# Patient Record
Sex: Male | Born: 1985 | Hispanic: Yes | Marital: Married | Smoking: Former smoker
Health system: Southern US, Community
[De-identification: ages and names within clinical notes are randomized; demographics above are authoritative.]

## PROBLEM LIST (undated history)

## (undated) DIAGNOSIS — M51369 Other intervertebral disc degeneration, lumbar region without mention of lumbar back pain or lower extremity pain: Secondary | ICD-10-CM

## (undated) DIAGNOSIS — M4800 Spinal stenosis, site unspecified: Secondary | ICD-10-CM

## (undated) DIAGNOSIS — R32 Unspecified urinary incontinence: Secondary | ICD-10-CM

## (undated) DIAGNOSIS — I10 Essential (primary) hypertension: Secondary | ICD-10-CM

## (undated) DIAGNOSIS — G629 Polyneuropathy, unspecified: Secondary | ICD-10-CM

## (undated) DIAGNOSIS — M5136 Other intervertebral disc degeneration, lumbar region: Secondary | ICD-10-CM

## (undated) HISTORY — PX: OTHER SURGICAL HISTORY: SHX169

## (undated) HISTORY — PX: LAPAROSCOPIC GASTRIC SLEEVE RESECTION: SHX5895

---

## 2014-10-31 ENCOUNTER — Encounter (HOSPITAL_COMMUNITY): Payer: Self-pay | Admitting: Emergency Medicine

## 2014-10-31 ENCOUNTER — Emergency Department (HOSPITAL_COMMUNITY)
Admission: EM | Admit: 2014-10-31 | Discharge: 2014-11-01 | Disposition: A | Discharge status: Home / Self Care | Payer: Medicare Other | Attending: Emergency Medicine | Admitting: Emergency Medicine

## 2014-10-31 DIAGNOSIS — Y831 Surgical operation with implant of artificial internal device as the cause of abnormal reaction of the patient, or of later complication, without mention of misadventure at the time of the procedure: Secondary | ICD-10-CM | POA: Insufficient documentation

## 2014-10-31 DIAGNOSIS — T814XXD Infection following a procedure, subsequent encounter: Secondary | ICD-10-CM | POA: Insufficient documentation

## 2014-10-31 DIAGNOSIS — Z79899 Other long term (current) drug therapy: Secondary | ICD-10-CM | POA: Insufficient documentation

## 2014-10-31 DIAGNOSIS — G629 Polyneuropathy, unspecified: Secondary | ICD-10-CM | POA: Diagnosis not present

## 2014-10-31 DIAGNOSIS — I10 Essential (primary) hypertension: Secondary | ICD-10-CM | POA: Insufficient documentation

## 2014-10-31 DIAGNOSIS — Z8739 Personal history of other diseases of the musculoskeletal system and connective tissue: Secondary | ICD-10-CM | POA: Diagnosis not present

## 2014-10-31 DIAGNOSIS — Z87891 Personal history of nicotine dependence: Secondary | ICD-10-CM | POA: Insufficient documentation

## 2014-10-31 DIAGNOSIS — IMO0001 Reserved for inherently not codable concepts without codable children: Secondary | ICD-10-CM

## 2014-10-31 DIAGNOSIS — G8918 Other acute postprocedural pain: Secondary | ICD-10-CM | POA: Diagnosis present

## 2014-10-31 HISTORY — DX: Other intervertebral disc degeneration, lumbar region: M51.36

## 2014-10-31 HISTORY — DX: Unspecified urinary incontinence: R32

## 2014-10-31 HISTORY — DX: Essential (primary) hypertension: I10

## 2014-10-31 HISTORY — DX: Other intervertebral disc degeneration, lumbar region without mention of lumbar back pain or lower extremity pain: M51.369

## 2014-10-31 HISTORY — DX: Spinal stenosis, site unspecified: M48.00

## 2014-10-31 HISTORY — DX: Polyneuropathy, unspecified: G62.9

## 2014-10-31 NOTE — ED Notes (Signed)
Pt states in June or July pt had a interstem bladder control stimulater placed  Pt states since then he has been having problems with his incision  Pt states it got infected and he went to the dr and was placed on antibiotics  Dr told him that if the antibiotics did not help he may have to have the device removed  Pt states he had the procedure done in [place] by Dr Trudie Buckler and his on call # is [phone] if we need any information on the device  Pt states yesterday the area was swollen and painful but today you can actually see part of the device

## 2014-11-01 ENCOUNTER — Emergency Department (HOSPITAL_COMMUNITY): Payer: Medicare Other

## 2014-11-01 ENCOUNTER — Encounter (HOSPITAL_COMMUNITY): Payer: Self-pay

## 2014-11-01 DIAGNOSIS — T814XXD Infection following a procedure, subsequent encounter: Secondary | ICD-10-CM | POA: Diagnosis not present

## 2014-11-01 LAB — CBC WITH DIFFERENTIAL/PLATELET
BASOS ABS: 0 10*3/uL (ref 0.0–0.1)
BASOS PCT: 0 %
Eosinophils Absolute: 0.3 10*3/uL (ref 0.0–0.7)
Eosinophils Relative: 3 %
HEMATOCRIT: 42.7 % (ref 39.0–52.0)
HEMOGLOBIN: 13.7 g/dL (ref 13.0–17.0)
LYMPHS PCT: 30 %
Lymphs Abs: 2.6 10*3/uL (ref 0.7–4.0)
MCH: 26.2 pg (ref 26.0–34.0)
MCHC: 32.1 g/dL (ref 30.0–36.0)
MCV: 81.6 fL (ref 78.0–100.0)
MONOS PCT: 9 %
Monocytes Absolute: 0.8 10*3/uL (ref 0.1–1.0)
NEUTROS ABS: 5 10*3/uL (ref 1.7–7.7)
NEUTROS PCT: 58 %
Platelets: 180 10*3/uL (ref 150–400)
RBC: 5.23 MIL/uL (ref 4.22–5.81)
RDW: 14.1 % (ref 11.5–15.5)
WBC: 8.7 10*3/uL (ref 4.0–10.5)

## 2014-11-01 LAB — I-STAT CHEM 8, ED
BUN: 11 mg/dL (ref 6–20)
CREATININE: 0.8 mg/dL (ref 0.61–1.24)
Calcium, Ion: 1.2 mmol/L (ref 1.12–1.23)
Chloride: 104 mmol/L (ref 101–111)
Glucose, Bld: 75 mg/dL (ref 65–99)
HEMATOCRIT: 46 % (ref 39.0–52.0)
Hemoglobin: 15.6 g/dL (ref 13.0–17.0)
POTASSIUM: 4.1 mmol/L (ref 3.5–5.1)
Sodium: 144 mmol/L (ref 135–145)
TCO2: 26 mmol/L (ref 0–100)

## 2014-11-01 LAB — URINALYSIS, ROUTINE W REFLEX MICROSCOPIC
Bilirubin Urine: NEGATIVE
Glucose, UA: NEGATIVE mg/dL
Hgb urine dipstick: NEGATIVE
Ketones, ur: NEGATIVE mg/dL
LEUKOCYTES UA: NEGATIVE
NITRITE: NEGATIVE
PH: 7 (ref 5.0–8.0)
Protein, ur: NEGATIVE mg/dL
Specific Gravity, Urine: 1.024 (ref 1.005–1.030)
Urobilinogen, UA: 1 mg/dL (ref 0.0–1.0)

## 2014-11-01 MED ORDER — CLINDAMYCIN HCL 300 MG PO CAPS: 300.0000 mg | ORAL_CAPSULE | Freq: Four times a day (QID) | ORAL | Status: AC

## 2014-11-01 MED ORDER — DEXAMETHASONE SODIUM PHOSPHATE 10 MG/ML IJ SOLN: 4.0000 mg | Freq: Once | INTRAMUSCULAR | Status: DC

## 2014-11-01 MED ORDER — DICLOFENAC SODIUM 1 % TD GEL: 4.0000 g | Freq: Four times a day (QID) | TRANSDERMAL | Status: AC

## 2014-11-01 MED ORDER — LORATADINE 10 MG PO TABS: 10.0000 mg | ORAL_TABLET | Freq: Once | ORAL | Status: DC

## 2014-11-01 MED ORDER — MUPIROCIN CALCIUM 2 % EX CREA: 1.0000 "application " | TOPICAL_CREAM | Freq: Two times a day (BID) | CUTANEOUS | Status: AC

## 2014-11-01 NOTE — ED Notes (Signed)
Pt returned from CT °

## 2014-11-01 NOTE — ED Notes (Signed)
Md at bedside

## 2014-11-01 NOTE — ED Provider Notes (Signed)
CSN: [id number]     Arrival date & time 10/31/14  2308 History  By signing my name below, I, Tanda Rockers, attest that this documentation has been prepared under the direction and in the presence of Nelta Caudill, MD. Electronically Signed: Tanda Rockers, ED Scribe. 11/01/2014. 12:33 AM.  Chief Complaint  Patient presents with  . Flank Pain   The history is provided by the patient. No language interpreter was used.     HPI Comments: Gary Hendrix is a 29 y.o. male who presents to the Emergency Department complaining of post operative problem x 2 weeks. Pt reports having interstem bladder control therapy placed in July by Dr. Trudie Buckler in [place] for his urinary incontinence. The device seemed to be working at first but pt notes problems with the incision site, including drainage. He was seen by Dr. Laural Benes 2 weeks ago while he was in [place] and was placed on Clindamycin. Pt was told that if symptoms did not persist that he would have to have the stimulator taken out. Pt called Dr. Henriette Combs office today and was told to come to the ED here for further evaluation. Pt notes intermittent urinary incontinence for the past couple of weeks as well. Denies nausea, vomiting, diarrhea, dysuria, or any other associated symptoms.    Past Medical History  Diagnosis Date  . Spinal stenosis   . DDD (degenerative disc disease), lumbar   . Hypertension   . Urinary incontinence   . Neuropathy [facility]    Past Surgical History  Procedure Laterality Date  . Laparoscopic gastric sleeve resection    . Interstem bladder control therapy      Family History  Problem Relation Age of Onset  . Diabetes Other   . Hypertension Other    Social History  Substance Use Topics  . Smoking status: Former Games developer  . Smokeless tobacco: None  . Alcohol Use: Yes     Comment: rare    Review of Systems  Constitutional: Negative for fever and chills.  Gastrointestinal: Negative for nausea, vomiting and diarrhea.   Genitourinary: Negative for dysuria.       Urinary incontinence  Skin:       Drainage from incision site on right flank  All other systems reviewed and are negative.  Allergies  Review of patient's allergies indicates no known allergies.  Home Medications   Prior to Admission medications   Medication Sig Start Date End Date Taking? Authorizing Provider  celecoxib (CELEBREX) 100 MG capsule Take 100 mg by mouth 2 (two) times daily as needed (for knee pain.).   Yes Historical Provider, MD  citalopram (CELEXA) 10 MG tablet Take 10 mg by mouth daily.   Yes Historical Provider, MD  clonazePAM (KLONOPIN) 0.5 MG tablet Take 0.5 mg by mouth 2 (two) times daily as needed for anxiety.   Yes Historical Provider, MD  gabapentin (NEURONTIN) 300 MG capsule Take 300 mg by mouth 3 (three) times daily.   Yes Historical Provider, MD  losartan (COZAAR) 25 MG tablet Take 25 mg by mouth daily.   Yes Historical Provider, MD  mirabegron ER (MYRBETRIQ) 25 MG TB24 tablet Take 25 mg by mouth daily.   Yes Historical Provider, MD  oxybutynin (DITROPAN-XL) 5 MG 24 hr tablet Take 5 mg by mouth at bedtime.   Yes Historical Provider, MD  traZODone (DESYREL) 50 MG tablet Take 50 mg by mouth at bedtime as needed for sleep.   Yes Historical Provider, MD   Triage Vitals: BP 126/67 mmHg  Pulse 70  Temp(Src) 97.5 F (36.4 C) (Oral)  Resp 18  SpO2 99%   Physical Exam  Constitutional: He is oriented to person, place, and time. He appears well-developed and well-nourished. No distress.  HENT:  Head: Normocephalic and atraumatic.  Mouth/Throat: Oropharynx is clear and moist and mucous membranes are normal.  Eyes: Conjunctivae and EOM are normal. Pupils are equal, round, and reactive to light.  Neck: Normal range of motion. Neck supple. No tracheal deviation present.  Cardiovascular: Normal rate and regular rhythm.   Pulmonary/Chest: Effort normal and breath sounds normal. No respiratory distress. He has no wheezes. He  has no rhonchi. He has no rales.  Abdominal: Soft. Bowel sounds are normal. There is no tenderness. There is no rebound and no guarding.  Musculoskeletal: Normal range of motion.  Neurological: He is alert and oriented to person, place, and time.  Skin: Skin is warm and dry.     Incision site to right flank; Serous drainage from the skin 9 mm ulceration with some metal seen  Psychiatric: He has a normal mood and affect. His behavior is normal.  Nursing note and vitals reviewed.   ED Course  Procedures (including critical care time)  DIAGNOSTIC STUDIES: Oxygen Saturation is 99% on RA, normal by my interpretation.    COORDINATION OF CARE: 12:29 AM-Discussed treatment plan which includes CBC, Chem 8, UA with pt at bedside and pt agreed to plan.   Labs Review Labs Reviewed - No data to display  Imaging Review No results found. I have personally reviewed and evaluated these lab results as part of my medical decision-making.   EKG Interpretation None      MDM   Final diagnoses:  None  Device in good position on Ct.  No pockets of infection.    Continue clindamycin will start mupiricine and follow up with urology as an outpatient    I, Clever Geraldo-RASCH,Lateia Fraser K, personally performed the services described in this documentation. All medical record entries made by the scribe were at my direction and in my presence.  I have reviewed the chart and discharge instructions and agree that the record reflects my personal performance and is accurate and complete. Tarica Harl-RASCH,Chayanne Filippi K.  11/01/2014. 4:19 AM.        Enisa Runyan, MD 11/01/14 [phone]

## 2014-11-01 NOTE — ED Notes (Addendum)
Pt given discharge instructions, verbalized understanding of need to follow up, reasons to return to the ED and medications to take at home. Pt has questions about need to cover device with bandage, and what to take for pain control. Will consult with MD and follow up. IV has been removed intact, site clean and dry, by significant other.

## 2014-11-01 NOTE — ED Notes (Signed)
Awaiting paperwork to be reprinted, unable to find.

## 2014-11-01 NOTE — ED Notes (Signed)
Voltaren Rx given. Pt denies further question or need at discharge.

## 2016-04-14 IMAGING — CT CT PELVIS W/O CM
2 of 3 series · 15 of 46 positions shown, 17 images · non-contrast
Comparison: None.

CLINICAL DATA: Pt is from [place], he had a bladder stimulator placed
in [REDACTED], for the last 2 weeks he has had problems at incision and
urinary incontinence, his Dr told him to come to er because it may
need to be removed.

EXAM:
CT PELVIS WITHOUT CONTRAST
TECHNIQUE: Multidetector CT imaging of the pelvis was performed following the
standard protocol without intravenous contrast.

[Series 2: pelvis wo · axial · 0.94mm/px · z∈[-596,-336]mm · 12 of 60 slices shown, 14 images]
[im 4/60  soft-tissue]
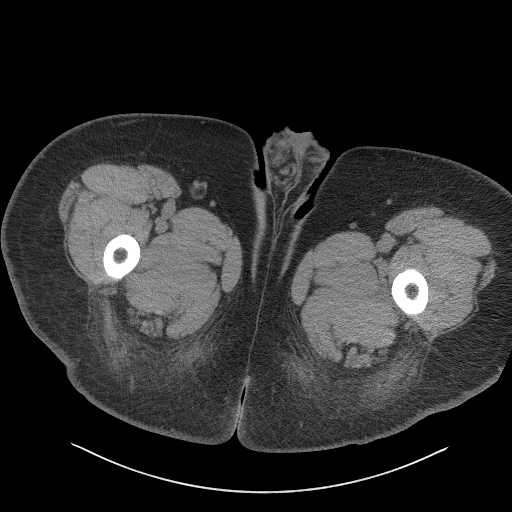
[im 4/60  bone]
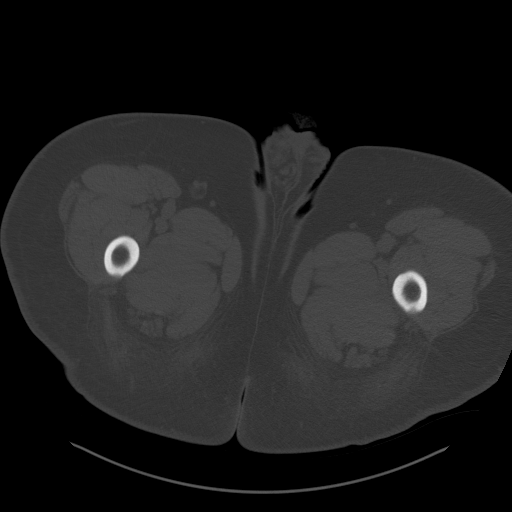
[im 8/60  soft-tissue]
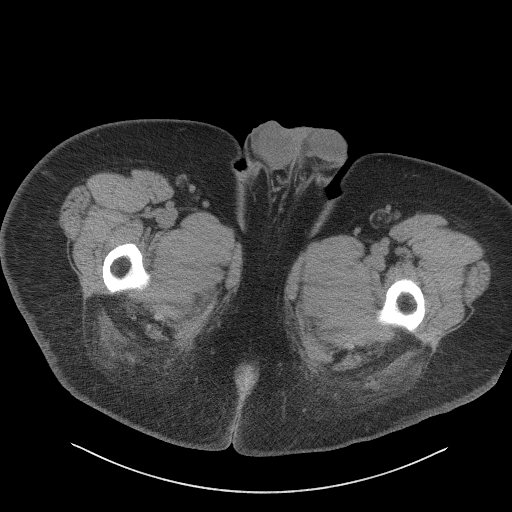
[im 14/60  soft-tissue]
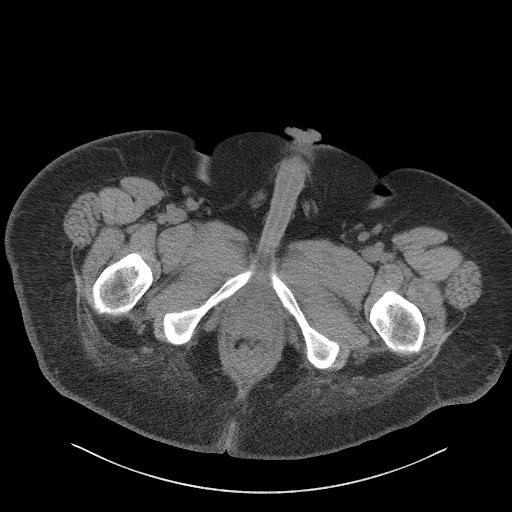
[im 18/60  soft-tissue]
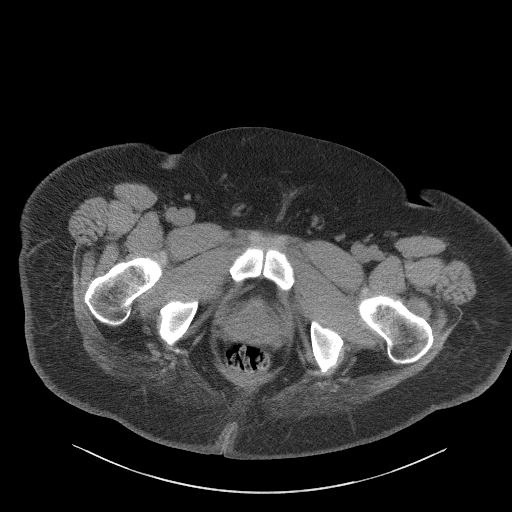
[im 23/60  soft-tissue]
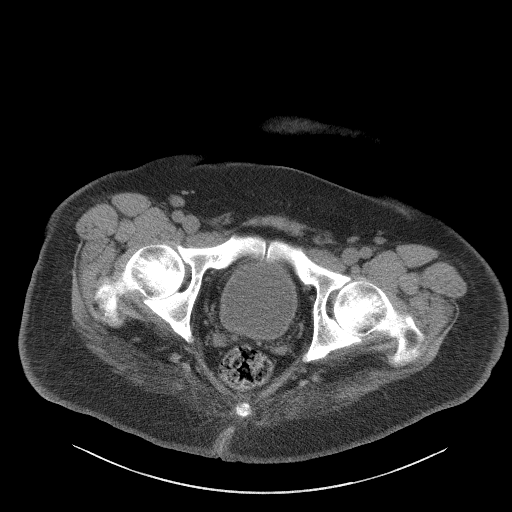
[im 27/60  soft-tissue]
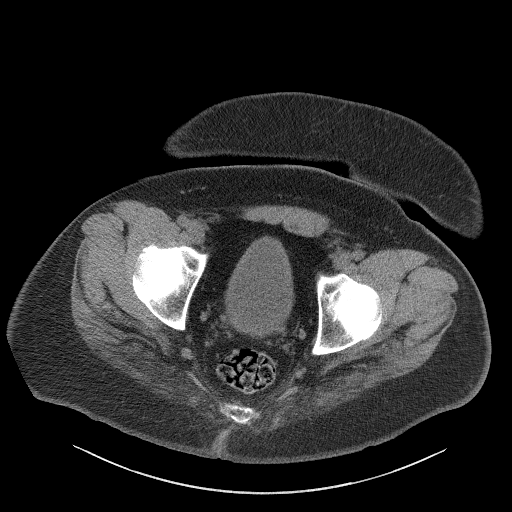
[im 33/60  soft-tissue]
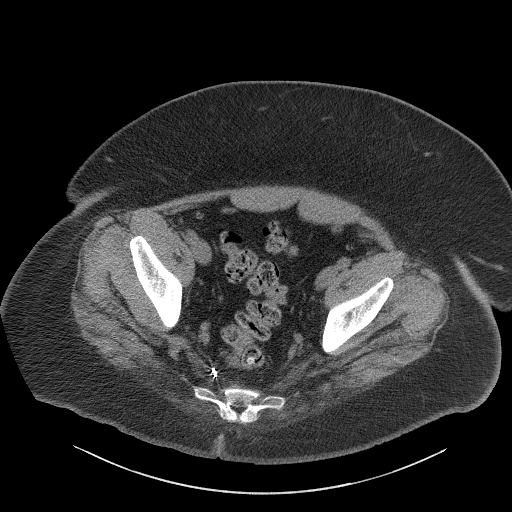
[im 37/60  soft-tissue]
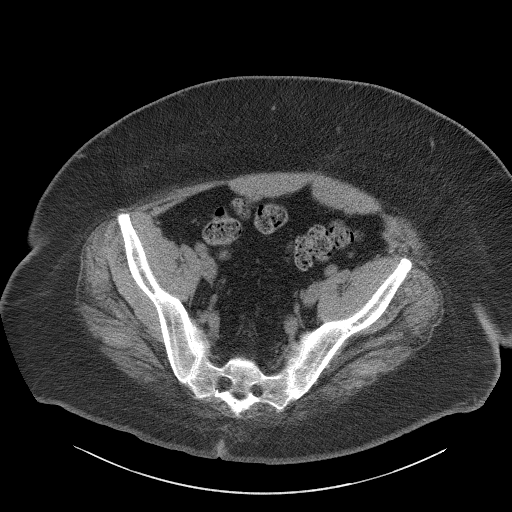
[im 42/60  soft-tissue]
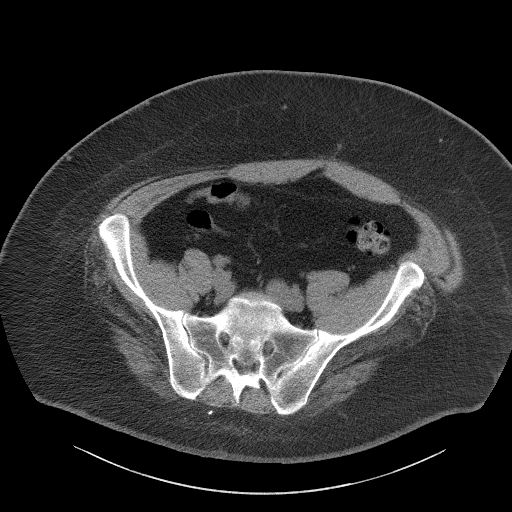
[im 42/60  bone]
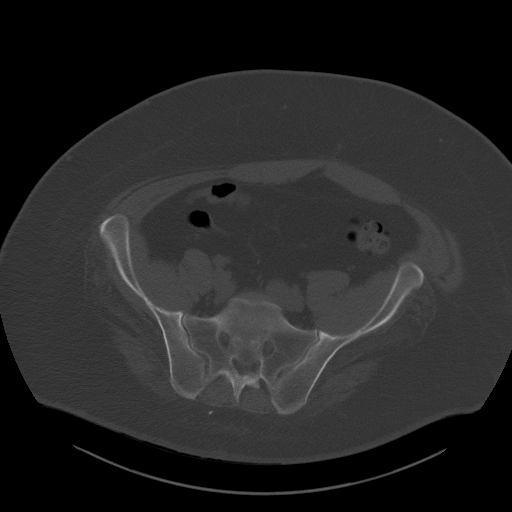
[im 46/60  soft-tissue]
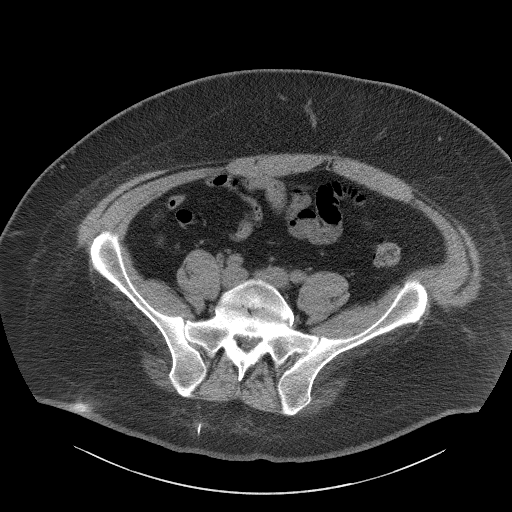
[im 52/60  soft-tissue]
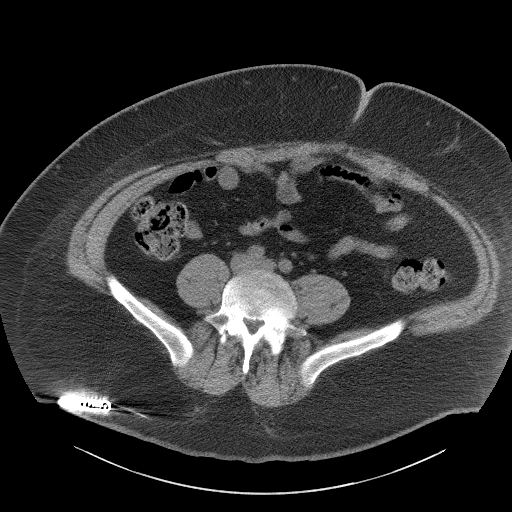
[im 56/60  soft-tissue]
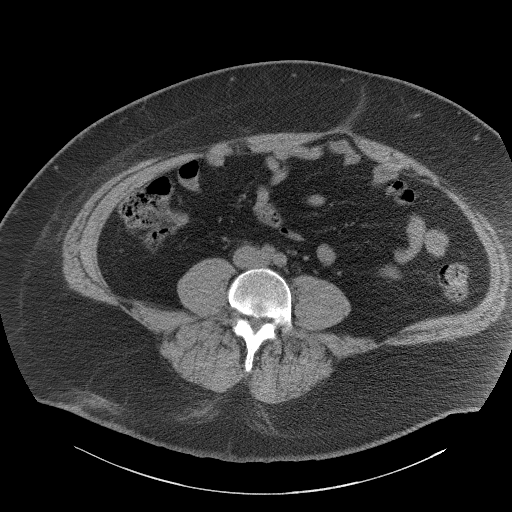

[Series 4: coronal images · coronal · 0.58mm/px · 3 of 117 slices shown]
[im 39/117  soft-tissue]
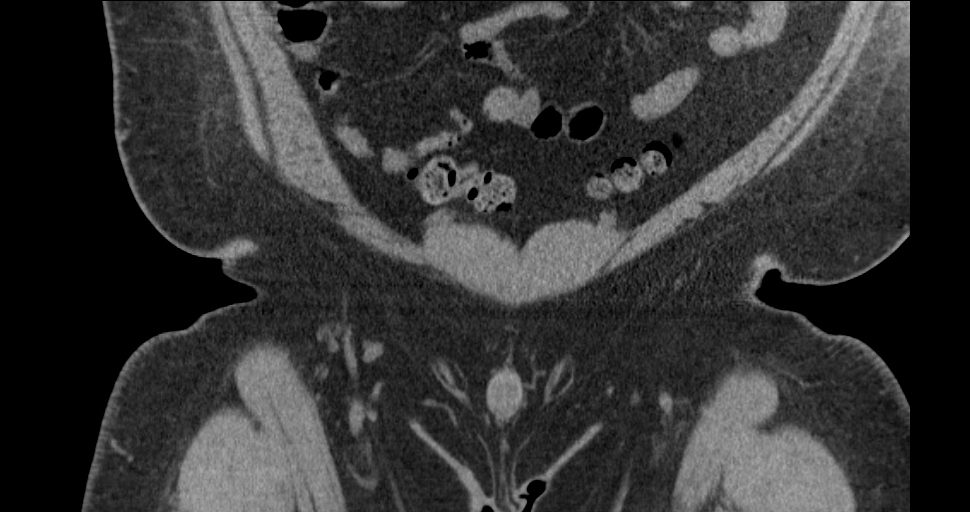
[im 52/117  soft-tissue]
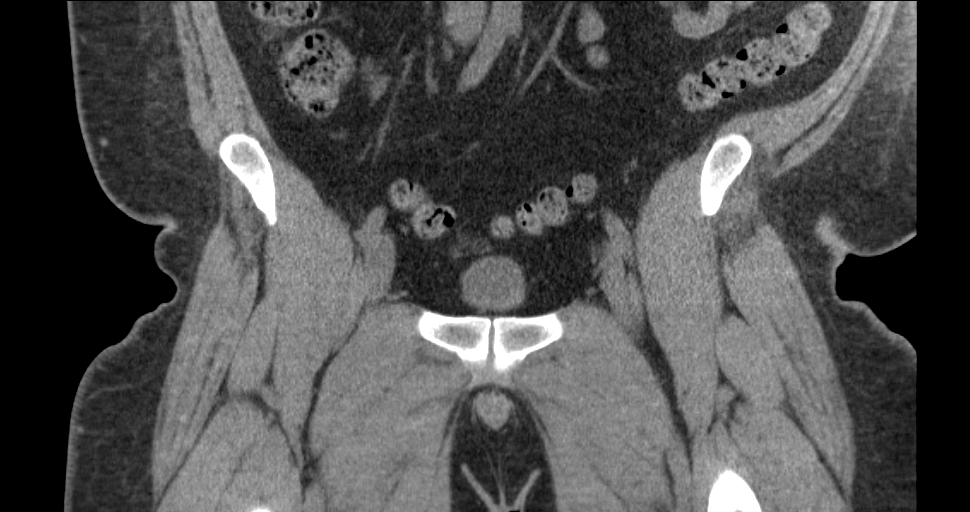
[im 65/117  soft-tissue]
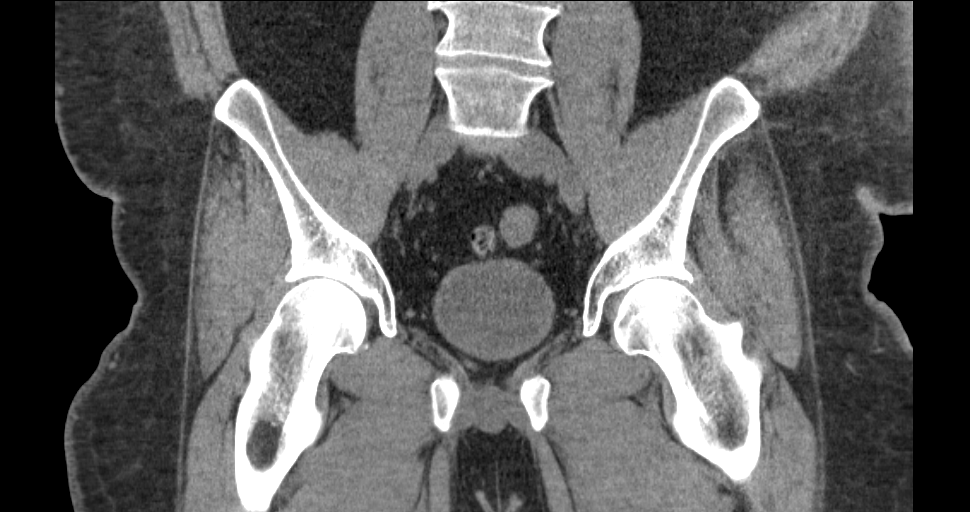

[15 of 46 positions shown; findings below may reference images not displayed]

FINDINGS: There are unremarkable appearances of the bladder stimulator, with
the lead passing through the right S3 neural foramen into the soft
tissues anterior to the right piriformis. The stimulating device and
the lead appear grossly intact. No hematoma or abnormal collection
is present along the course of the lead. No acute inflammatory
changes are evident in the pelvis or lower abdomen. Visible portions
of the bowel are unremarkable. Urinary bladder, prostate and seminal
vesicles appear unremarkable. No pelvic adenopathy or mass is
evident. No significant skeletal lesions are evident.
IMPRESSION: Intact and unremarkable appearances of the bladder stimulator. No
acute findings are evident.
